# Patient Record
Sex: Male | Born: 1990 | Race: White | Hispanic: No | Marital: Single | Smoking: Current every day smoker
Health system: Southern US, Community
[De-identification: ages and names within clinical notes are randomized; demographics above are authoritative.]

---

## 2017-10-28 ENCOUNTER — Encounter: Payer: Self-pay | Admitting: Emergency Medicine

## 2017-10-28 ENCOUNTER — Emergency Department
Admission: EM | Admit: 2017-10-28 | Discharge: 2017-10-28 | Disposition: A | Discharge status: Home / Self Care | Payer: Worker's Compensation | Attending: Emergency Medicine | Admitting: Emergency Medicine

## 2017-10-28 ENCOUNTER — Emergency Department: Payer: Worker's Compensation

## 2017-10-28 DIAGNOSIS — S4992XA Unspecified injury of left shoulder and upper arm, initial encounter: Secondary | ICD-10-CM | POA: Diagnosis present

## 2017-10-28 DIAGNOSIS — Y9289 Other specified places as the place of occurrence of the external cause: Secondary | ICD-10-CM | POA: Diagnosis not present

## 2017-10-28 DIAGNOSIS — Y9389 Activity, other specified: Secondary | ICD-10-CM | POA: Diagnosis not present

## 2017-10-28 DIAGNOSIS — M75102 Unspecified rotator cuff tear or rupture of left shoulder, not specified as traumatic: Secondary | ICD-10-CM

## 2017-10-28 DIAGNOSIS — F17210 Nicotine dependence, cigarettes, uncomplicated: Secondary | ICD-10-CM | POA: Insufficient documentation

## 2017-10-28 DIAGNOSIS — S46092A Other injury of muscle(s) and tendon(s) of the rotator cuff of left shoulder, initial encounter: Secondary | ICD-10-CM | POA: Insufficient documentation

## 2017-10-28 DIAGNOSIS — Y990 Civilian activity done for income or pay: Secondary | ICD-10-CM | POA: Insufficient documentation

## 2017-10-28 MED ORDER — TRAMADOL HCL 50 MG PO TABS: 50.0000 mg | ORAL_TABLET | Freq: Four times a day (QID) | ORAL | 0 refills | Status: AC | PRN

## 2017-10-28 MED ORDER — MELOXICAM 15 MG PO TABS: 15.0000 mg | ORAL_TABLET | Freq: Every day | ORAL | 1 refills | Status: AC

## 2017-10-28 NOTE — ED Provider Notes (Signed)
[facility] Emergency Department Provider Note  ____________________________________________  Time seen: Approximately 10:36 PM  I have reviewed the triage vital signs and the nursing notes.   HISTORY  Chief Complaint Shoulder Injury    HPI Bruce Cooke is a 27 y.o. male presents to the emergency department with 10 out of 10 left shoulder pain after patient reports that he fell from a work truck.  Patient did not hit his head or lose consciousness.  Patient reports that since the fall, he has been unable to perform abduction at the left shoulder.  Patient reports that he has been keeping his left arm closely as his side.  No changes in sensation of the left upper extremity.  No neck pain.  No skin compromise.  No prior left arm fractures.   History reviewed. No pertinent past medical history.  There are no active problems to display for this patient.   History reviewed. No pertinent surgical history.  Prior to Admission medications   Medication Sig Start Date End Date Taking? Authorizing Provider  meloxicam (MOBIC) 15 MG tablet Take 1 tablet (15 mg total) by mouth daily for 7 days. 10/28/17 11/04/17  Orvil FeilWoods, Laporshia Hogen M, PA-C  traMADol (ULTRAM) 50 MG tablet Take 1 tablet (50 mg total) by mouth every 6 (six) hours as needed for up to 3 days. 10/28/17 10/31/17  Orvil FeilWoods, Jezabelle Chisolm M, PA-C    Allergies Patient has no known allergies.  No family history on file.  Social History Social History   Tobacco Use  . Smoking status: Current Every Day Smoker    Packs/day: 1.00    Types: Cigarettes  . Smokeless tobacco: Never Used  Substance Use Topics  . Alcohol use: Yes    Frequency: Never    Comment: occasional  . Drug use: No     Review of Systems  Constitutional: No fever/chills Eyes: No visual changes. No discharge ENT: No upper respiratory complaints. Cardiovascular: no chest pain. Respiratory: no cough. No SOB. Musculoskeletal: Patient has left shoulder  pain.  Skin: Negative for rash, abrasions, lacerations, ecchymosis. Neurological: Negative for headaches, focal weakness or numbness.   ____________________________________________   PHYSICAL EXAM:  VITAL SIGNS: ED Triage Vitals  Enc Vitals Group     BP 10/28/17 2045 (!) 152/94     Pulse Rate 10/28/17 2045 (!) 102     Resp 10/28/17 2045 18     Temp 10/28/17 2045 97.9 F (36.6 C)     Temp Source 10/28/17 2045 Oral     SpO2 10/28/17 2045 97 %     Weight 10/28/17 2045 270 lb (122.5 kg)     Height 10/28/17 2045 6\' 3"  (1.905 m)     Head Circumference --      Peak Flow --      Pain Score 10/28/17 2048 8     Pain Loc --      Pain Edu? --      Excl. in GC? --      Constitutional: Alert and oriented. Well appearing and in no acute distress. Eyes: Conjunctivae are normal. PERRL. EOMI. Head: Atraumatic. Cardiovascular: Normal rate, regular rhythm. Normal S1 and S2.  Good peripheral circulation. Respiratory: Normal respiratory effort without tachypnea or retractions. Lungs CTAB. Good air entry to the bases with no decreased or absent breath sounds. Musculoskeletal: Patient has weakness elicited with left rotator cuff testing.  Patient is unable to perform full range of motion at the left shoulder, likely secondary to pain.  Patient demonstrates  full range of motion at the left elbow is able to perform pronation and supination without difficulty.  Patient is able to move all 5 left fingers.  Palpable radial pulse, left. Neurologic:  Normal speech and language. No gross focal neurologic deficits are appreciated.  Skin:  Skin is warm, dry and intact. No rash noted. Psychiatric: Mood and affect are normal. Speech and behavior are normal. Patient exhibits appropriate insight and judgement.   ____________________________________________   LABS (all labs ordered are listed, but only abnormal results are displayed)  Labs Reviewed - No data to  display ____________________________________________  EKG   ____________________________________________  RADIOLOGY Geraldo Pitter, personally viewed and evaluated these images (plain radiographs) as part of my medical decision making, as well as reviewing the written report by the radiologist.  Dg Shoulder Left  Result Date: 10/28/2017 CLINICAL DATA:  27 year old male with fall and left shoulder injury. EXAM: LEFT SHOULDER - 2+ VIEW COMPARISON:  None. FINDINGS: There is no evidence of fracture or dislocation. There is no evidence of arthropathy or other focal bone abnormality. Soft tissues are unremarkable. IMPRESSION: Negative. Electronically Signed   By: Elgie Collard M.D.   On: 10/28/2017 21:21    ____________________________________________    PROCEDURES  Procedure(s) performed:    Procedures    Medications - No data to display   ____________________________________________   INITIAL IMPRESSION / ASSESSMENT AND PLAN / ED COURSE  Pertinent labs & imaging results that were available during my care of the patient were reviewed by me and considered in my medical decision making (see chart for details).  Review of the [place] CSRS was performed in accordance of the [facility] prior to dispensing any controlled drugs.     Left shoulder pain Differential diagnosis includes shoulder contusion, rotator cuff tear, fracture and laceration.  On physical exam, patient had weakness elicited with left rotator cuff testing.  X-ray examination revealed no acute fractures or bony abnormalities.  No skin compromise was identified on physical exam.  I am suspicious for left rotator cuff tear.  Patient was placed in a sling and discharged with tramadol.  He was referred to orthopedics, Dr. Martha Clan.  The importance of follow-up with an orthopedist was emphasized.  Vital signs were reassuring prior to discharge.  All patient questions were  answered.    ____________________________________________  FINAL CLINICAL IMPRESSION(S) / ED DIAGNOSES  Final diagnoses:  Tear of left rotator cuff, unspecified tear extent      NEW MEDICATIONS STARTED DURING THIS VISIT:  ED Discharge Orders        Ordered    traMADol (ULTRAM) 50 MG tablet  Every 6 hours PRN     10/28/17 2210    meloxicam (MOBIC) 15 MG tablet  Daily     10/28/17 2210          This chart was dictated using voice recognition software/Dragon. Despite best efforts to proofread, errors can occur which can change the meaning. Any change was purely unintentional.    Orvil Feil, PA-C 10/28/17 2240    Sharyn Creamer, MD 11/01/17 [phone]

## 2017-10-28 NOTE — ED Notes (Signed)
Pt reports that he fell off a truck at work approx 12 feet - pt states he is unable to lift his left arm and that there is a "popping" noise when he does attempt to move the left shoulder - pt denies hitting head

## 2017-10-28 NOTE — ED Notes (Signed)
W/C profile states UDS and Breath Analysis required only upon request. No answer at either number provided on W/C Profile Form for employer [phone]) or contact person Jamse MeadRose Hogan [phone]).

## 2017-10-28 NOTE — ED Triage Notes (Signed)
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  Patient states he has limited range of motion in the arm at this moment.  Patient ambulatory to triage and in NAD with even and unlabored respirations.  Patient able to move from the forearm down with no difficulty noted.  Patient will be filing as a workers Hydrologistcomp claim with [employer].

## 2017-11-18 ENCOUNTER — Ambulatory Visit (INDEPENDENT_AMBULATORY_CARE_PROVIDER_SITE_OTHER): Payer: Worker's Compensation | Admitting: Orthopedic Surgery

## 2017-11-18 ENCOUNTER — Encounter (INDEPENDENT_AMBULATORY_CARE_PROVIDER_SITE_OTHER): Payer: Self-pay | Admitting: Orthopedic Surgery

## 2017-11-18 DIAGNOSIS — M25512 Pain in left shoulder: Secondary | ICD-10-CM

## 2017-11-18 NOTE — Addendum Note (Signed)
Addended byPrescott Parma: Zahniya Zellars on: 11/18/2017 09:10 AM   Modules accepted: Orders

## 2017-11-18 NOTE — Progress Notes (Signed)
Office Visit Note   Patient: Bruce Cooke           Date of Birth: [birth year]           MRN: [id number] Visit Date: 11/18/2017 Requested by: No referring provider defined for this encounter. PCP: Patient, No Pcp Per  Subjective: Chief Complaint  Patient presents with  . Left Shoulder - Injury    HPI: Bruce Cooke is a 27 year old patient who was working in his job as a Magazine features editor.  He fell 6 feet from a height onto his left shoulder.  He has had pain and new popping since that time.  He tried light duty but could not really drive the truck because of the strength required to turn the wheel which he does not have in his left arm.  He reports new popping in the left arm.  He is a smoker.  He denies any frank episode of instability in the left shoulder.  Outside radiographs are reviewed and are unremarkable.              ROS: All systems reviewed are negative as they relate to the chief complaint within the history of present illness.  Patient denies  fevers or chills.   Assessment & Plan: Visit Diagnoses:  1. Acute pain of left shoulder     Plan: Impression is fall from a 6 foot height onto outstretched arm on the left-hand side with new weakness and popping which is not improved despite taking Mobic and tramadol.  Plan is out of work for 3 weeks.  Needs MRI arthrogram of the left shoulder to evaluate for labral tear versus rotator cuff tear.  Does not really sound like it is instability related.  He does have some weakness on exam.  I will see him back after that study.  Follow-Up Instructions: Return for after MRI.   Orders:  No orders of the defined types were placed in this encounter.  No orders of the defined types were placed in this encounter.     Procedures: No procedures performed   Clinical Data: No additional findings.  Objective: Vital Signs: There were no vitals taken for this visit.  Physical Exam:   Constitutional: Patient appears  well-developed HEENT:  Head: Normocephalic Eyes:EOM are normal Neck: Normal range of motion Cardiovascular: Normal rate Pulmonary/chest: Effort normal Neurologic: Patient is alert Skin: Skin is warm Psychiatric: Patient has normal mood and affect    Ortho Exam: Orthopedic exam demonstrates good cervical spine range of motion.  Patient has 5 out of 5 grip EPL FPL interosseous wrist flexion wrist extension biceps triceps and deltoid strength.  Radial pulses intact bilaterally.  He does have weakness to infraspinatus testing on the left compared to the right.  He also has weakness to abduction on the left compared to the right.  Some mild popping is noted with passive range of motion of the left shoulder.  No discrete AC joint tenderness is noted left versus right.  Negative apprehension relocation testing.  Specialty Comments:  No specialty comments available.  Imaging: No results found.   PMFS History: There are no active problems to display for this patient.  History reviewed. No pertinent past medical history.  History reviewed. No pertinent family history.  History reviewed. No pertinent surgical history. Social History   Occupational History  . Not on file  Tobacco Use  . Smoking status: Current Every Day Smoker    Packs/day: 1.00    Types: Cigarettes  .  Smokeless tobacco: Never Used  Substance and Sexual Activity  . Alcohol use: Yes    Frequency: Never    Comment: occasional  . Drug use: No  . Sexual activity: Not on file

## 2017-11-25 ENCOUNTER — Telehealth (INDEPENDENT_AMBULATORY_CARE_PROVIDER_SITE_OTHER): Payer: Self-pay

## 2017-11-25 NOTE — Telephone Encounter (Signed)
Per Dr August Saucerean called patient and advised that MRI report showed nondisplaced shoulder fracture and that he needed to limit motion/movement with that arm and wear his sling. He will follow up next Wednesday at scheduled appt.

## 2017-12-02 ENCOUNTER — Encounter (INDEPENDENT_AMBULATORY_CARE_PROVIDER_SITE_OTHER): Payer: Self-pay | Admitting: Orthopedic Surgery

## 2017-12-02 ENCOUNTER — Ambulatory Visit (INDEPENDENT_AMBULATORY_CARE_PROVIDER_SITE_OTHER): Payer: Worker's Compensation | Admitting: Orthopedic Surgery

## 2017-12-02 DIAGNOSIS — S42255D Nondisplaced fracture of greater tuberosity of left humerus, subsequent encounter for fracture with routine healing: Secondary | ICD-10-CM | POA: Diagnosis not present

## 2017-12-02 NOTE — Progress Notes (Signed)
   Office Visit Note   Patient: Bruce Cooke           Date of Birth: 08/26/1991           MRN: [id number] Visit Date: 12/02/2017 Requested by: No referring provider defined for this encounter. PCP: Patient, No Pcp Per  Subjective: Chief Complaint  Patient presents with  . Left Shoulder - Follow-up    HPI: Bruce Cooke is a patient with left shoulder pain.  Date of injury 10/28/2017.  MRI scan is reviewed today.  He has nondisplaced incomplete greater tuberosity fracture.  He has been in a sling.  He has been out of work.  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              ROS: All systems reviewed are negative as they relate to the chief complaint within the history of present illness.  Patient denies  fevers or chills.   Assessment & Plan: Visit Diagnoses:  1. Closed nondisplaced fracture of greater tuberosity of left humerus with routine healing, subsequent encounter     Plan: Impression is left shoulder nondisplaced greater tuberosity fracture.  Plan is continue sling immobilization for 2 more weeks then start physical therapy in 2 weeks for range of motion and gentle strengthening exercises.  He will be out of work for 4 more weeks do not anticipate surgery being required for [facility] at this time I will see him back at that time for clinical examination  Follow-Up Instructions: Return in about 4 weeks (around 12/30/2017).   Orders:  No orders of the defined types were placed in this encounter.  No orders of the defined types were placed in this encounter.     Procedures: No procedures performed   Clinical Data: No additional findings.  Objective: Vital Signs: There were no vitals taken for this visit.  Physical Exam:   Constitutional: Patient appears well-developed HEENT:  Head: Normocephalic Eyes:EOM are normal Neck: Normal range of motion Cardiovascular: Normal rate Pulmonary/chest: Effort  normal Neurologic: Patient is alert Skin: Skin is warm Psychiatric: Patient has normal mood and affect    Ortho Exam: Orthopedic examination demonstrates good deltoid strength and rotator cuff strength with some mild tenderness around the deltoid region.  Motor sensory function to the hand is intact.  Specialty Comments:  No specialty comments available.  Imaging: No results found.   PMFS History: There are no active problems to display for this patient.  History reviewed. No pertinent past medical history.  History reviewed. No pertinent family history.  History reviewed. No pertinent surgical history. Social History   Occupational History  . Not on file  Tobacco Use  . Smoking status: Current Every Day Smoker    Packs/day: 1.00    Types: Cigarettes  . Smokeless tobacco: Never Used  Substance and Sexual Activity  . Alcohol use: Yes    Frequency: Never    Comment: occasional  . Drug use: No  . Sexual activity: Not on file

## 2017-12-04 ENCOUNTER — Telehealth (INDEPENDENT_AMBULATORY_CARE_PROVIDER_SITE_OTHER): Payer: Self-pay

## 2017-12-04 NOTE — Telephone Encounter (Signed)
Faxed 12/02/17 office and work note to [employer] per her request

## 2017-12-25 ENCOUNTER — Telehealth (INDEPENDENT_AMBULATORY_CARE_PROVIDER_SITE_OTHER): Payer: Self-pay

## 2017-12-25 NOTE — Telephone Encounter (Signed)
Faxed the 12/02/17 office and work note to ForestNicole w/[employer] per her request  Fax# [phone]

## 2017-12-30 ENCOUNTER — Telehealth (INDEPENDENT_AMBULATORY_CARE_PROVIDER_SITE_OTHER): Payer: Self-pay | Admitting: Orthopedic Surgery

## 2017-12-30 ENCOUNTER — Ambulatory Visit (INDEPENDENT_AMBULATORY_CARE_PROVIDER_SITE_OTHER): Payer: Worker's Compensation | Admitting: Orthopedic Surgery

## 2017-12-30 ENCOUNTER — Encounter (INDEPENDENT_AMBULATORY_CARE_PROVIDER_SITE_OTHER): Payer: Self-pay | Admitting: Orthopedic Surgery

## 2017-12-30 DIAGNOSIS — S42255D Nondisplaced fracture of greater tuberosity of left humerus, subsequent encounter for fracture with routine healing: Secondary | ICD-10-CM

## 2017-12-30 NOTE — Telephone Encounter (Signed)
Bruce Cooke called asking for an updated work note to be faxed to her at [phone].

## 2017-12-30 NOTE — Telephone Encounter (Signed)
faxed

## 2018-01-03 ENCOUNTER — Encounter (INDEPENDENT_AMBULATORY_CARE_PROVIDER_SITE_OTHER): Payer: Self-pay | Admitting: Orthopedic Surgery

## 2018-01-03 NOTE — Progress Notes (Signed)
   Office Visit Note   Patient: Bruce Cooke           Date of Birth: 09/10/1991           MRN: [id number] Visit Date: 12/30/2017 Requested by: No referring provider defined for this encounter. PCP: Patient, No Pcp Per  Subjective: Chief Complaint  Patient presents with  . Left Shoulder - Follow-up, Fracture    HPI: Bruce Cooke is a patient who sustained left proximal humerus fracture October 28, 2017.  He is getting some better.  He is about 6 weeks out.  Still has a lot of popping in the shoulder.  He is not taking any medicine for pain.  He had a second visit for therapy yesterday.  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  It is physical work.                ROS: All systems reviewed are negative as they relate to the chief complaint within the history of present illness.  Patient denies  fevers or chills.   Assessment & Plan: Visit Diagnoses:  1. Closed nondisplaced fracture of greater tuberosity of left humerus with routine healing, subsequent encounter     Plan: Impression is left proximal humerus fracture with some weakness plan is continue therapy.  He still is a little weak on examination.  Come back in 4 weeks and I think that he will be ready to return to work at that time.  Out of work note for the next 4 weeks provided.  Follow-Up Instructions: Return in about 1 month (around 01/27/2018).   Orders:  No orders of the defined types were placed in this encounter.  No orders of the defined types were placed in this encounter.     Procedures: No procedures performed   Clinical Data: No additional findings.  Objective: Vital Signs: There were no vitals taken for this visit.  Physical Exam:   Constitutional: Patient appears well-developed HEENT:  Head: Normocephalic Eyes:EOM are normal Neck: Normal range of motion Cardiovascular: Normal rate Pulmonary/chest: Effort normal Neurologic: Patient is alert Skin: Skin is  warm Psychiatric: Patient has normal mood and affect    Ortho Exam: Orthopedic exam demonstrates pretty reasonable passive range of motion of that left arm.  I do not detect any coarseness or grinding on examination today.  Rotator cuff is functional and intact to infraspinatus and supraspinatus testing.  Does have a little bit of pain with resistance and lifting of resisted arm weight.  Specialty Comments:  No specialty comments available.  Imaging: No results found.   PMFS History: There are no active problems to display for this patient.  History reviewed. No pertinent past medical history.  History reviewed. No pertinent family history.  History reviewed. No pertinent surgical history. Social History   Occupational History  . Not on file  Tobacco Use  . Smoking status: Current Every Day Smoker    Packs/day: 1.00    Types: Cigarettes  . Smokeless tobacco: Never Used  Substance and Sexual Activity  . Alcohol use: Yes    Frequency: Never    Comment: occasional  . Drug use: No  . Sexual activity: Not on file

## 2018-01-13 ENCOUNTER — Telehealth (INDEPENDENT_AMBULATORY_CARE_PROVIDER_SITE_OTHER): Payer: Self-pay

## 2018-01-13 NOTE — Telephone Encounter (Signed)
Please advise. Thanks.  

## 2018-01-13 NOTE — Telephone Encounter (Signed)
Received voicemail from [employer] stating that pts employer can accommodate any work restrictions and wants to know if Dr. August Saucerean would consider letting pt rtw with restrictions?  pt has f/u on 02/01/18

## 2018-01-14 NOTE — Telephone Encounter (Signed)
Faxed note to Bobo[employer]ole @ [phone]

## 2018-01-14 NOTE — Telephone Encounter (Signed)
Okay to return to work with left arm in sling 4 hours a day until return office visit no left arm work to be done.  He can start that next Monday

## 2018-01-14 NOTE — Telephone Encounter (Signed)
I put note in chart.

## 2018-01-28 ENCOUNTER — Telehealth (INDEPENDENT_AMBULATORY_CARE_PROVIDER_SITE_OTHER): Payer: Self-pay | Admitting: Orthopedic Surgery

## 2018-01-28 NOTE — Telephone Encounter (Signed)
Misty StanleyLisa from [employer] called wanting the office notes faxed over to her from the patients appointment back in March. Fax # [phone]

## 2018-01-28 NOTE — Telephone Encounter (Signed)
Is it ok to fax these records?

## 2018-02-01 ENCOUNTER — Encounter (INDEPENDENT_AMBULATORY_CARE_PROVIDER_SITE_OTHER): Payer: Self-pay | Admitting: Orthopedic Surgery

## 2018-02-01 ENCOUNTER — Ambulatory Visit (INDEPENDENT_AMBULATORY_CARE_PROVIDER_SITE_OTHER): Payer: Worker's Compensation | Admitting: Orthopedic Surgery

## 2018-02-01 ENCOUNTER — Telehealth (INDEPENDENT_AMBULATORY_CARE_PROVIDER_SITE_OTHER): Payer: Self-pay | Admitting: Orthopedic Surgery

## 2018-02-01 DIAGNOSIS — S42255D Nondisplaced fracture of greater tuberosity of left humerus, subsequent encounter for fracture with routine healing: Secondary | ICD-10-CM

## 2018-02-01 NOTE — Telephone Encounter (Signed)
[employer] adjuster, I faxed OV note

## 2018-02-01 NOTE — Telephone Encounter (Signed)
Lisa-(Adjustor) with [employer] called needing work restrictions and if patient need a follow up appointment with Dr. August Saucerean. The number to contact Misty StanleyLisa is [phone]   The fax# is [phone]

## 2018-02-01 NOTE — Telephone Encounter (Signed)
Faxed work note that was given to patient at his appt today.

## 2018-02-02 ENCOUNTER — Encounter (INDEPENDENT_AMBULATORY_CARE_PROVIDER_SITE_OTHER): Payer: Self-pay | Admitting: Orthopedic Surgery

## 2018-02-02 NOTE — Progress Notes (Signed)
   Post-Op Visit Note   Patient: Bruce Cooke           Date of Birth: 10/05/1991           MRN: [id number] Visit Date: 02/01/2018 PCP: Patient, No Pcp Per   Assessment & Plan:  Chief Complaint:  Chief Complaint  Patient presents with  . Left Shoulder - Follow-up, Fracture   Visit Diagnoses:  1. Closed nondisplaced fracture of greater tuberosity of left humerus with routine healing, subsequent encounter     Plan: Bruce Cooke is a patient with left greater tuberosity nondisplaced fracture.  Date of injury 10/28/2017.  He is doing well with no problems.  He is been in physical therapy strengthening.  On examination he has full active and passive range of motion of the shoulder without coarse grinding or crepitus.  Rotator cuff strength is excellent to infraspinatus supraspinatus and subscap muscle testing.  At this time a little release Bruce Cooke back to work.  Activity as tolerated.  Follow-up with me as needed.  Do not think that he will merit a rating at this time due to his excellent strength and range of motion.  Follow-Up Instructions: Return if symptoms worsen or fail to improve.   Orders:  No orders of the defined types were placed in this encounter.  No orders of the defined types were placed in this encounter.   Imaging: No results found.  PMFS History: There are no active problems to display for this patient.  History reviewed. No pertinent past medical history.  History reviewed. No pertinent family history.  History reviewed. No pertinent surgical history. Social History   Occupational History  . Not on file  Tobacco Use  . Smoking status: Current Every Day Smoker    Packs/day: 1.00    Types: Cigarettes  . Smokeless tobacco: Never Used  Substance and Sexual Activity  . Alcohol use: Yes    Frequency: Never    Comment: occasional  . Drug use: No  . Sexual activity: Not on file

## 2018-03-01 ENCOUNTER — Telehealth (INDEPENDENT_AMBULATORY_CARE_PROVIDER_SITE_OTHER): Payer: Self-pay

## 2018-03-01 NOTE — Telephone Encounter (Signed)
Faxed all  4 office notes and work notes to [facility] per her request

## 2018-07-09 IMAGING — CR DG SHOULDER 2+V*L*
1 series · 3 of 3 positions shown · non-contrast
Comparison: None.

CLINICAL DATA: 26-year-old male with fall and left shoulder injury.

EXAM:
LEFT SHOULDER - 2+ VIEW

[Series 1: dg shoulder left · 0.14mm/px · 3 of 3 slices shown]
[im 1/3]
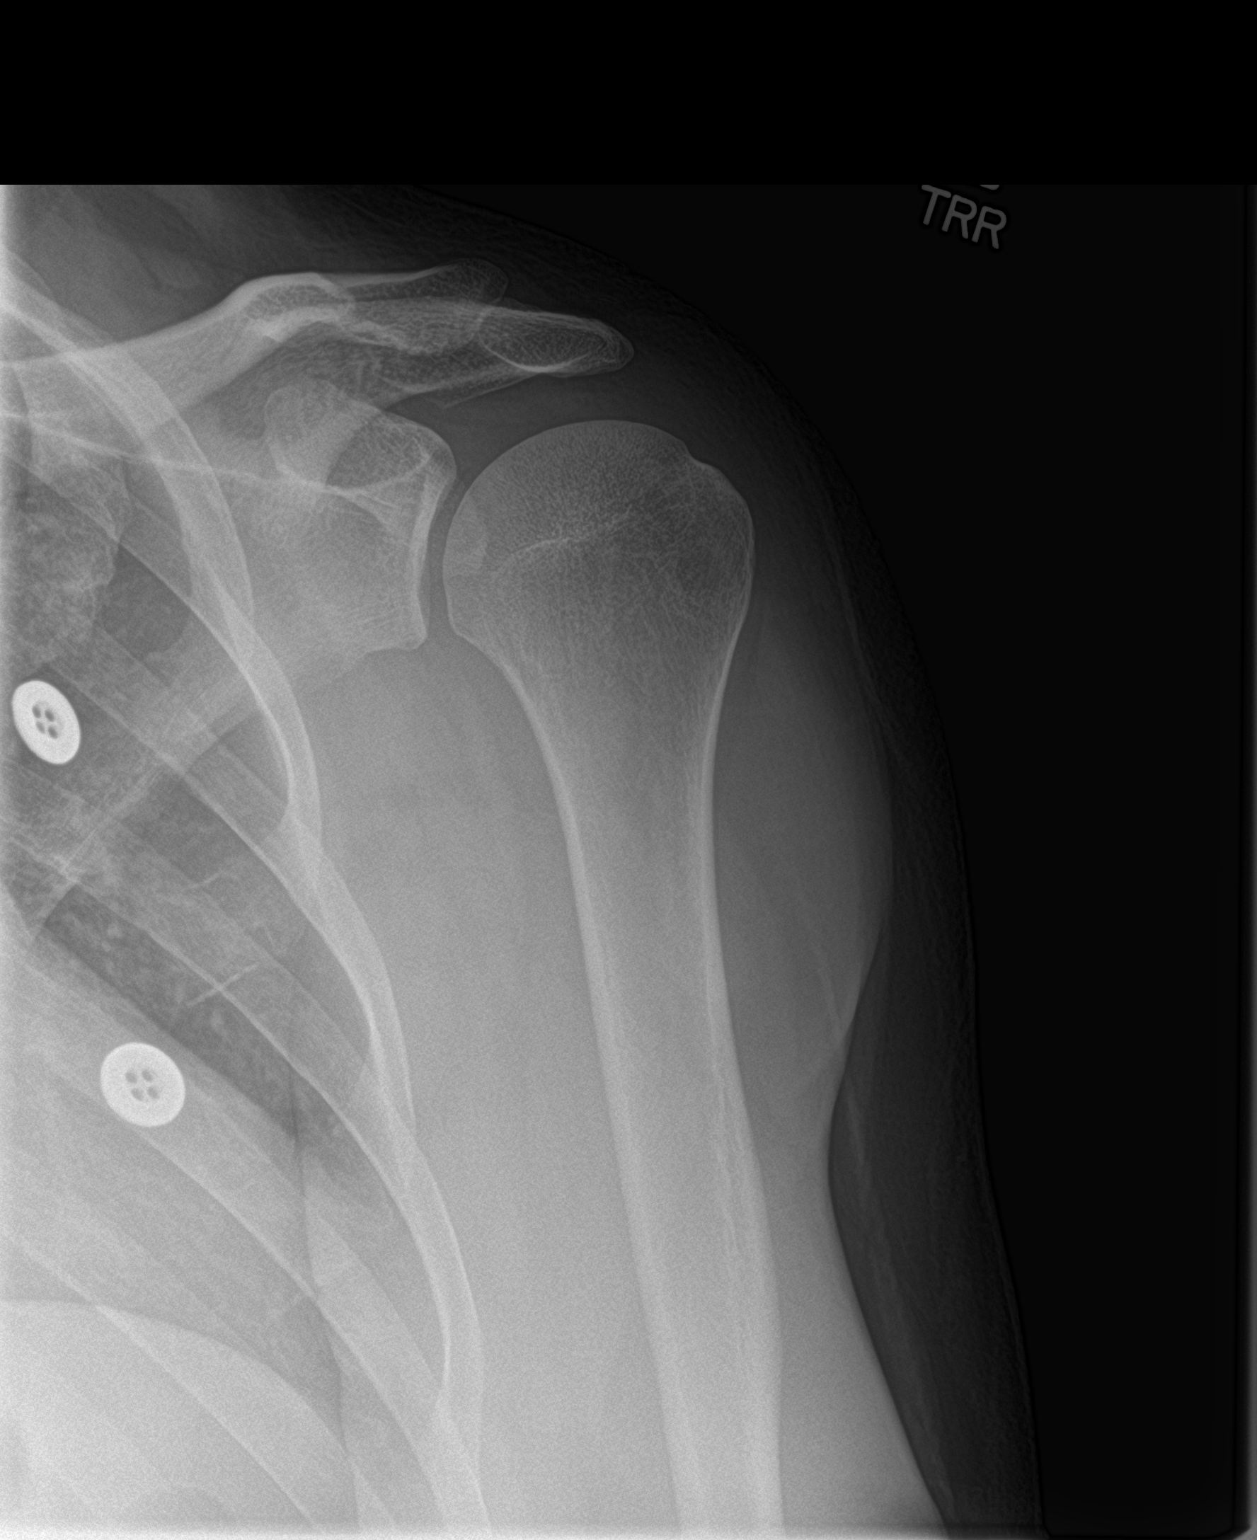
[im 2/3]
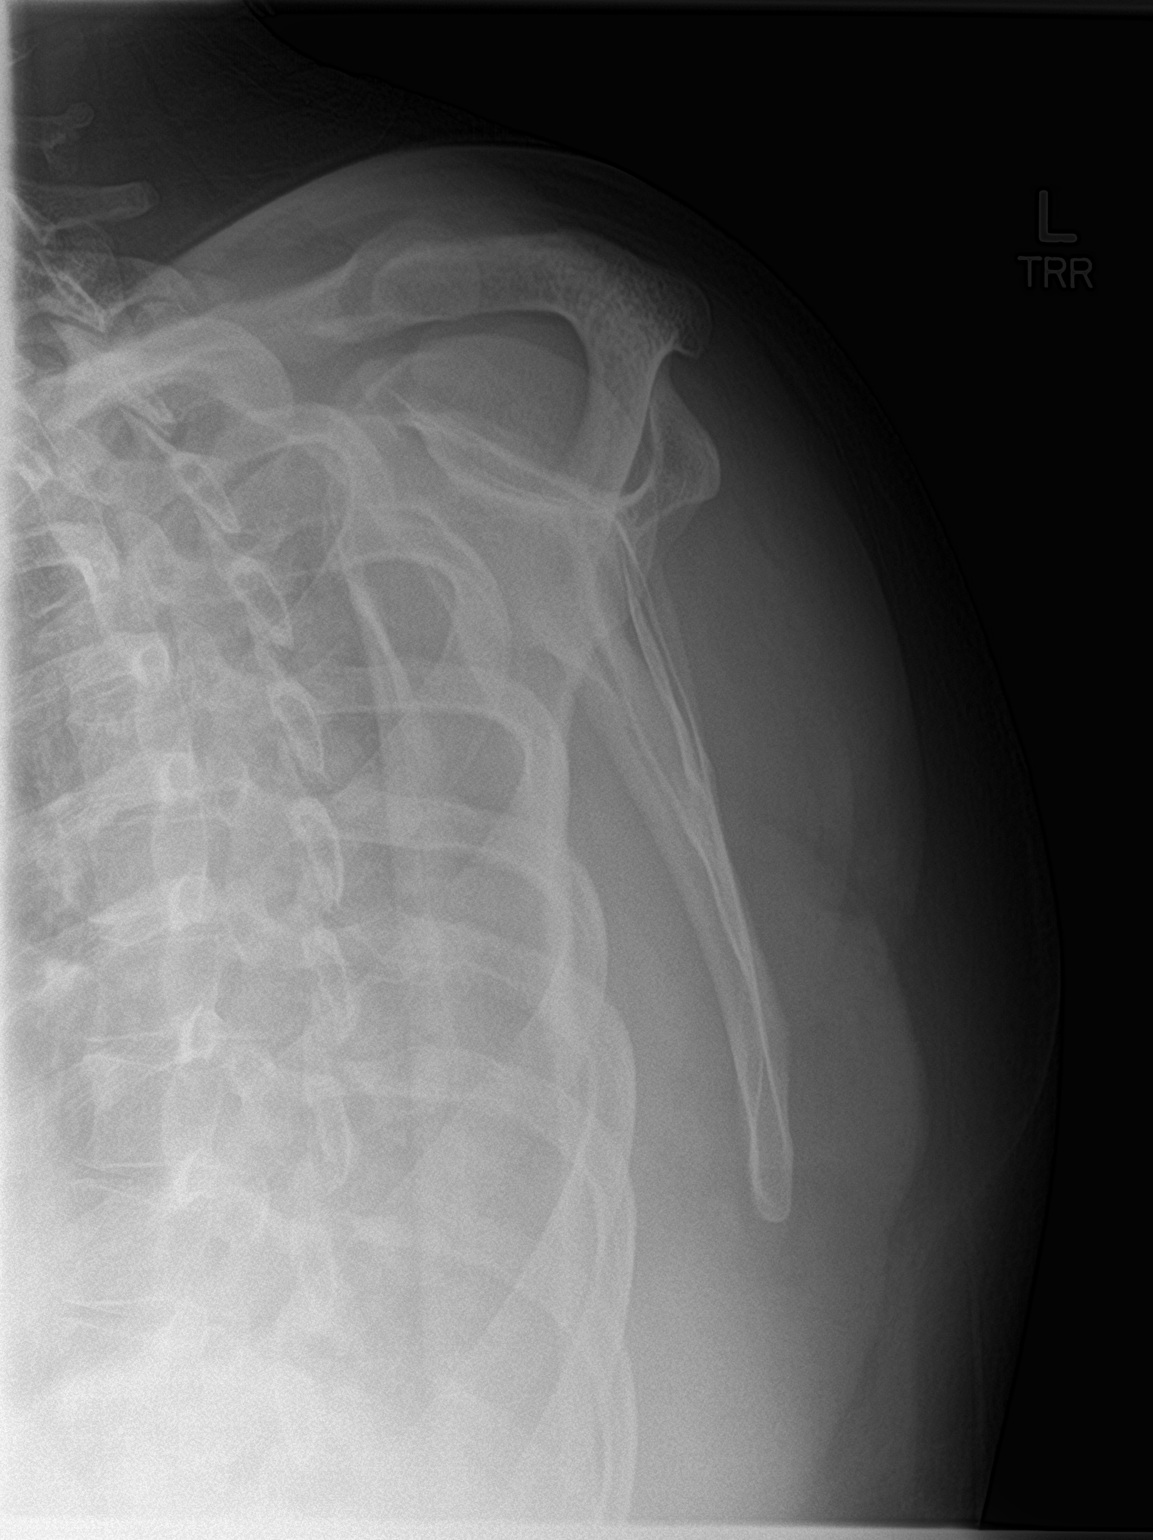
[im 3/3]
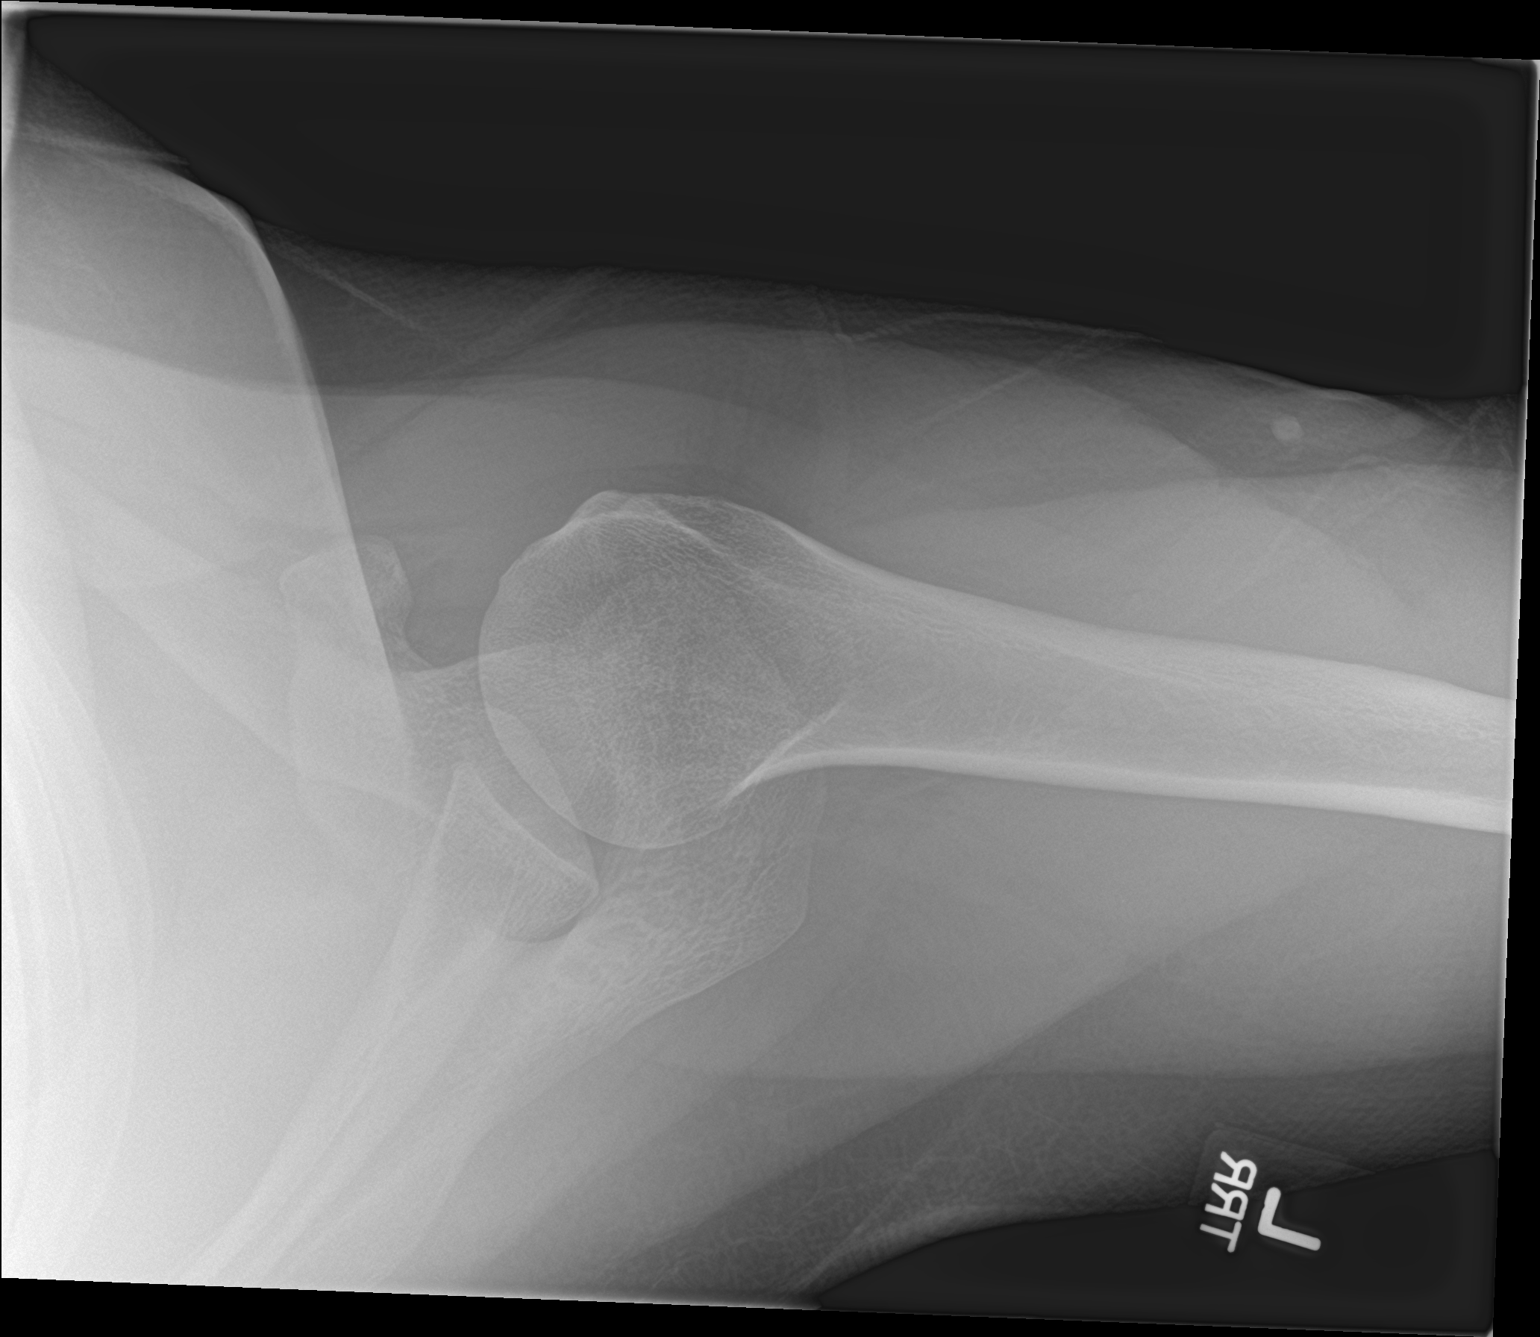

[3 of 3 positions shown; findings below may reference images not displayed]

FINDINGS: There is no evidence of fracture or dislocation. There is no
evidence of arthropathy or other focal bone abnormality. Soft
tissues are unremarkable.
IMPRESSION: Negative.
